# Patient Record
Sex: Female | Born: 1990 | Race: White | Hispanic: No | Marital: Single | Smoking: Former smoker
Health system: Southern US, Community
[De-identification: ages and names within clinical notes are randomized; demographics above are authoritative.]

## PROBLEM LIST (undated history)

## (undated) HISTORY — PX: APPENDECTOMY: SHX54

---
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
## 2011-02-02 ENCOUNTER — Ambulatory Visit: Payer: Self-pay | Admitting: Gynecology

## 2011-04-01 ENCOUNTER — Ambulatory Visit: Payer: Self-pay | Admitting: Internal Medicine

## 2011-04-01 DIAGNOSIS — Z0289 Encounter for other administrative examinations: Secondary | ICD-10-CM

## 2011-04-22 NOTE — Discharge Summary (Signed)
Amanda Fuller, Amanda Fuller             ACCOUNT NO.:  [id number]   MEDICAL RECORD NO.:  [id number]          PATIENT TYPE:  INP   LOCATION:  6119                         FACILITY:  [facility]   PHYSICIAN:  Leonia Corona, M.D.  DATE OF BIRTH:  Apr 23, 1991   DATE OF ADMISSION:  11/03/2005  DATE OF DISCHARGE:  11/05/2005                                 DISCHARGE SUMMARY   REASON FOR ADMISSION:  Acute appendicitis.   DISCHARGE MEDICATIONS:  Tylenol 650 mg p.o. q.4-6h. p.r.n. pain.   DISCHARGE DIAGNOSIS:  Acute appendicitis status post open appendectomy.   HISTORY OF PRESENT ILLNESS:  On November 04, 2005, the patient complained of  central umbilical pain radiating down to the right lower quadrant with  nausea and vomiting.  The patient was brought into the emergency department  where a CT was performed which concurred with the diagnosis of acute  appendicitis.   HOSPITAL COURSE:  The patient had an open appendectomy performed by Dr.  Leeanne Mannan under general anesthesia on November 04, 2005 and had an inflamed  appendix removed.  There was no abscess.  The patient was taken to recovery  and then to the pediatric floor.  The patient tolerated the procedure well  and tolerated p.o. and advanced diet adequately.  Pain was well-controlled  on morphine and then Tylenol on a p.r.n. basis.  The patient was stable and  ready for discharge home and was passing gas and ambulating well.   PROCEDURE:  Open appendectomy on November 04, 2005.  Please see the dictated  operative report by Dr. Leeanne Mannan.   FOLLOW UP:  The patient is to follow up with Dr. Leeanne Mannan, pediatric  subspecialist, as scheduled in 10 days.  The patient does not have a primary  care physician and was given the name of Dr. Barth Kirks at [facility], fax number [phone] if they decide to use her as a primary  care physician.  Their results will be faxed to Dr. Roe Rutherford office.   DISCHARGE DATA:  Discharge weight was  53.2 kg.   CONDITION ON DISCHARGE:  Stable.     ______________________________  Pediatrics Resident      Leonia Corona, M.D.  Electronically Signed   PR/MEDQ  D:  11/05/2005  T:  11/06/2005  Job:  [id number]

## 2011-04-22 NOTE — Op Note (Signed)
NAMEMORIA, BROPHY             ACCOUNT NO.:  [id number]   MEDICAL RECORD NO.:  [id number]          PATIENT TYPE:  INP   LOCATION:  6119                         FACILITY:  [facility]   PHYSICIAN:  Leonia Corona, M.D.  DATE OF BIRTH:  1991/04/25   DATE OF PROCEDURE:  11/03/2005  DATE OF DISCHARGE:                                 OPERATIVE REPORT   PREOPERATIVE DIAGNOSIS:  Acute appendicitis.   POSTOPERATIVE DIAGNOSIS:  Acute appendicitis.   PROCEDURE PERFORMED:  Open appendectomy.   SURGEON:  Leonia Corona, M.D.   ASSISTANT:  Nurse.   ANESTHESIA:  General endotracheal anesthesia.   INDICATIONS FOR PROCEDURE:  This 20 year old female child was evaluated for  right lower quadrant abdominal pain clinically highly suspicious for acute  appendicitis.  The diagnosis was confirmed on CT scan, hence, the indication  of the procedure.   PROCEDURE IN DETAIL:  The patient is brought in the operating room, placed  supine on the operating table, general endotracheal anesthesia is given.  The right lower quadrant of the abdomen and surrounding area of the  abdominal wall was cleaned, prepped and draped in the usual manner.  A right  lower skin incision centered at McBurney's point measuring about 2-3 cm was  made with the knife.  The incision was deepened through the subcutaneous  tissue using electrocautery until the external aponeurosis was reached.  The  external aponeurosis was incised with a knife and divided with scissors.  The internal oblique and transversus abdominis muscles were split along its  fibers with the help of a blunt tip hemostat and [employer] retractor until  the peritoneum was visualized which was held between two hemostats and  incised inbetween with scissors.  A small opening into the peritoneal cavity  was made.  The peritoneal opening was enlarged with scissors, retractors  were placed inside the peritoneum, the cecum was identified and tinea were  followed  which lead to the appendix.  The appendix was held with Babcock  forceps and delivered out of the incision partially until it was completely  delivered out.  It was found to be inflamed, early inflammation was noted  all around the length of the appendix.  The base was healthy.  The  mesoappendix was divided between two clamps and ligated using 3-0 silk until  the base of the appendix was clear.  The base of the appendix was crushed  with a clamp above the base, the base was ligated using 2-0 Vicryl, and the  appendix was divided and removed from the field.  The mucosa of the  appendicular stump was cauterized.  A purse-string suture was placed around  the base on the cecal wall and tied.  After tying the purse-string suture  using 3-0 silk, the base of the appendix was buried on the cecal wall.  The  cecum was returned back into the peritoneal cavity.  The peritoneum was  irrigated with normal saline until the returning fluid was clear.  No edema  or bleeding was noted.  The peritoneum was closed with 2-0 Vicryl running  stitch, the wound was closed in layers.  The internal oblique and transverse  abdominis were reapproximated with a single stitch of 2-0 Vicryl.  The skin  was closed in two layers, the deep subcutaneous layer using a single stitch  of 4-0 Monocryl and the skin with 4-0 Monocryl subcuticular stitch.  Approximately 10 mL of 0.25% Marcaine with epinephrine was infiltrated in  and around the incision for  postoperative pain control.  Steri-Strips were applied which was covered  with a sterile gauze and Tegaderm dressing.  The patient tolerated the  procedure well which was smooth and uneventful.  The patient was extubated  and transferred to the recovery room in good, stable condition.      Leonia Corona, M.D.  Electronically Signed     SF/MEDQ  D:  11/04/2005  T:  11/04/2005  Job:  [id number]

## 2011-08-30 ENCOUNTER — Encounter: Payer: Self-pay | Admitting: *Deleted

## 2011-08-31 ENCOUNTER — Encounter: Payer: Self-pay | Admitting: Women's Health

## 2011-09-02 ENCOUNTER — Encounter: Payer: Self-pay | Admitting: Women's Health

## 2012-02-15 ENCOUNTER — Ambulatory Visit (INDEPENDENT_AMBULATORY_CARE_PROVIDER_SITE_OTHER): Payer: BC Managed Care – PPO | Admitting: Family

## 2012-02-15 ENCOUNTER — Encounter: Payer: Self-pay | Admitting: Family

## 2012-02-15 VITALS — BP 108/70 | Temp 98.7°F | Ht 65.5 in | Wt 123.0 lb

## 2012-02-15 DIAGNOSIS — F411 Generalized anxiety disorder: Secondary | ICD-10-CM

## 2012-02-15 DIAGNOSIS — F419 Anxiety disorder, unspecified: Secondary | ICD-10-CM

## 2012-02-15 LAB — CBC
HCT: 42.2 % (ref 36.0–46.0)
MCHC: 32.8 g/dL (ref 30.0–36.0)
MCV: 86.6 fl (ref 78.0–100.0)
Platelets: 267 10*3/uL (ref 150.0–400.0)
RBC: 4.88 Mil/uL (ref 3.87–5.11)
RDW: 12.7 % (ref 11.5–14.6)
WBC: 9.5 10*3/uL (ref 4.5–10.5)

## 2012-02-15 LAB — TSH: TSH: 1.15 u[IU]/mL (ref 0.35–5.50)

## 2012-02-15 LAB — BASIC METABOLIC PANEL
BUN: 15 mg/dL (ref 6–23)
CO2: 24 mEq/L (ref 19–32)
Calcium: 9.9 mg/dL (ref 8.4–10.5)
Chloride: 103 mEq/L (ref 96–112)
Creatinine, Ser: 0.8 mg/dL (ref 0.4–1.2)
GFR: 95.52 mL/min (ref >60.00)
Glucose, Bld: 87 mg/dL (ref 70–99)
Potassium: 3.7 mEq/L (ref 3.5–5.1)
Sodium: 138 mEq/L (ref 135–145)

## 2012-02-15 NOTE — Progress Notes (Signed)
Subjective:    Patient ID: Amanda Fuller, female    DOB: 10-18-1991, 20 y.o.   MRN: [id number]  HPI 20y/o female, new patient, is in today with c/o a bright light in her line of vision that occurs when she is deep in thought. She reports feeling more anxious  And worrying about possibly having brain cancer after watching a TV show last week with a young woman her age dying of cancer. In the past she's combated anxiety. She has never been on medication for treatment. She denies any thoughts of hopelessness, helplessness, thoughts of death or dying. Patient denies lightheadedness, dizziness, chest pain, palpitations, shortness of breath or edema. No family history of anxiety.   Review of Systems  Constitutional: Negative.   HENT: Negative.   Eyes: Positive for visual disturbance.       Bright light in her line of vision at times.   Respiratory: Negative.   Cardiovascular: Negative.   Musculoskeletal: Negative.   Skin: Negative.   Hematological: Negative.   Psychiatric/Behavioral: The patient is nervous/anxious.    No past medical history on file.  History   Social History  . Marital Status: Single    Spouse Name: N/A    Number of Children: N/A  . Years of Education: N/A   Occupational History  . Not on file.   Social History Main Topics  . Smoking status: Never Smoker   . Smokeless tobacco: Never Used  . Alcohol Use: No  . Drug Use: No  . Sexually Active: No   Other Topics Concern  . Not on file   Social History Narrative  . No narrative on file    Past Surgical History  Procedure Date  . Appendectomy     Family History  Problem Relation Age of Onset  . Diabetes Maternal Grandmother   . Hypertension Maternal Grandmother   . Heart disease Maternal Grandmother   . Heart disease Maternal Grandfather     No Known Allergies  Current Outpatient Prescriptions on File Prior to Visit  Medication Sig Dispense Refill  . Norgestimate-Ethinyl Estradiol Triphasic  (ORTHO TRI-CYCLEN LO) 0.18/0.215/0.25 MG-25 MCG tablet Take 1 tablet by mouth daily.          BP 108/70  Temp(Src) 98.7 F (37.1 C) (Oral)  Ht 5' 5.5" (1.664 m)  Wt 123 lb (55.792 kg)  BMI 20.16 kg/m2chart and     Objective:   Physical Exam  Constitutional: She is oriented to person, place, and time. She appears well-developed and well-nourished.  HENT:  Right Ear: External ear normal.  Left Ear: External ear normal.  Nose: Nose normal.  Mouth/Throat: Oropharynx is clear and moist.  Neck: Normal range of motion. Neck supple.  Cardiovascular: Normal rate, regular rhythm and normal heart sounds.   Pulmonary/Chest: Effort normal and breath sounds normal.  Abdominal: Soft. Bowel sounds are normal.  Musculoskeletal: Normal range of motion.  Neurological: She is alert and oriented to person, place, and time.  Skin: Skin is warm.  Psychiatric: She has a normal mood and affect.          Assessment & Plan:  Assessment: Anxiety, visual disturbances  Plan: Spoke to patient about the areas ways of adjusting her anxiety concerns to include therapy and pharmacological treatment. Patient has elected to go with group therapy and she's worried he checked into. She will consider medication if this isn't effective. Lab sent to include BMP, CBC and TSH Will notify patient of the results. Patient does not show any  signs and symptoms of a brain tumor. Therefore, further workup at this point I felt it necessary.

## 2012-02-15 NOTE — Patient Instructions (Signed)

## 2012-02-16 ENCOUNTER — Telehealth: Payer: Self-pay | Admitting: Family

## 2012-02-16 NOTE — Telephone Encounter (Signed)
Left message on personally identified voicemail to notify pt labs are normal and have been mailed to address on file

## 2012-02-16 NOTE — Telephone Encounter (Signed)
Pt requesting results of labs. Please contact °

## 2012-03-28 ENCOUNTER — Ambulatory Visit (INDEPENDENT_AMBULATORY_CARE_PROVIDER_SITE_OTHER): Payer: BC Managed Care – PPO | Admitting: Family

## 2012-03-28 ENCOUNTER — Encounter: Payer: Self-pay | Admitting: Family

## 2012-03-28 VITALS — BP 100/70 | Temp 98.5°F | Wt 122.0 lb

## 2012-03-28 DIAGNOSIS — J029 Acute pharyngitis, unspecified: Secondary | ICD-10-CM

## 2012-03-28 MED ORDER — AMOXICILLIN 400 MG/5ML PO SUSR: 800.0000 mg | Freq: Two times a day (BID) | ORAL | Status: AC

## 2012-03-28 NOTE — Progress Notes (Signed)
  Subjective:    Patient ID: Amanda Fuller, female    DOB: [birth year], 21 y.o.   MRN: [id number]  HPI Comments: 21 yo  [place] female presents with c/o inflamed, red tonsils started 5 days ago. "I have history of tonsilitis, 2 episodes over the last year and it was suggested I could have them removed if it bothers me. I can not afford to miss work and I am ok with liquid antibiotic therapy." Denies sorethroat, fever, chills, ear pain, headaches, nausea,difficulty swallowing, or other associated s/s. C/o occasional productive cough expectorating scanty white sputum.   Sore Throat  Associated symptoms include congestion. Pertinent negatives include no drooling, ear discharge, ear pain, neck pain or trouble swallowing.      Review of Systems  Constitutional: Negative.   HENT: Positive for congestion. Negative for hearing loss, ear pain, nosebleeds, sore throat, facial swelling, rhinorrhea, sneezing, drooling, mouth sores, trouble swallowing, neck pain, neck stiffness, dental problem, voice change, postnasal drip, sinus pressure, tinnitus and ear discharge.   Eyes: Negative.   Respiratory: Negative.   Cardiovascular: Negative.    No past medical history on file.  History   Social History  . Marital Status: Single    Spouse Name: N/A    Number of Children: N/A  . Years of Education: N/A   Occupational History  . Not on file.   Social History Main Topics  . Smoking status: Never Smoker   . Smokeless tobacco: Never Used  . Alcohol Use: No  . Drug Use: No  . Sexually Active: No   Other Topics Concern  . Not on file   Social History Narrative  . No narrative on file    Past Surgical History  Procedure Date  . Appendectomy     Family History  Problem Relation Age of Onset  . Diabetes Maternal Grandmother   . Hypertension Maternal Grandmother   . Heart disease Maternal Grandmother   . Heart disease Maternal Grandfather     No Known Allergies  Current Outpatient  Prescriptions on File Prior to Visit  Medication Sig Dispense Refill  . Norgestimate-Ethinyl Estradiol Triphasic (ORTHO TRI-CYCLEN LO) 0.18/0.215/0.25 MG-25 MCG tablet Take 1 tablet by mouth daily.          BP 100/70  Temp(Src) 98.5 F (36.9 C) (Oral)  Wt 122 lb (55.339 kg)chart    Objective:   Physical Exam  Constitutional: She is oriented to person, place, and time. She appears well-developed and well-nourished. No distress.  HENT:  Right Ear: External ear normal.  Left Ear: External ear normal.  Nose: Nose normal.  Mouth/Throat: Oropharynx is clear and moist.  Eyes: Right eye exhibits no discharge. Left eye exhibits no discharge.  Cardiovascular: Normal rate, regular rhythm, normal heart sounds and intact distal pulses.  Exam reveals no gallop and no friction rub.   No murmur heard. Pulmonary/Chest: Effort normal and breath sounds normal. No respiratory distress. She has no wheezes. She has no rales. She exhibits no tenderness.  Abdominal: Soft. Bowel sounds are normal. She exhibits no distension and no mass. There is no tenderness. There is no rebound and no guarding.  Neurological: She is alert and oriented to person, place, and time.  Skin: Skin is warm and dry. She is not diaphoretic.          Assessment & Plan:  Assessment: Pharyngitis  Plan; Amoxicillin, teaching handouts provided on diagnosis and treatment. Encouraged to RTC if s/s persist or get worse

## 2012-03-28 NOTE — Patient Instructions (Signed)

## 2012-08-28 ENCOUNTER — Encounter: Payer: Self-pay | Admitting: Family

## 2012-08-28 ENCOUNTER — Ambulatory Visit (INDEPENDENT_AMBULATORY_CARE_PROVIDER_SITE_OTHER): Payer: BC Managed Care – PPO | Admitting: Family

## 2012-08-28 VITALS — BP 108/74 | HR 98 | Temp 98.7°F | Wt 124.0 lb

## 2012-08-28 DIAGNOSIS — H538 Other visual disturbances: Secondary | ICD-10-CM

## 2012-08-28 NOTE — Progress Notes (Signed)
  Subjective:    Patient ID: Amanda Fuller, female    DOB: 1991-07-27, 21 y.o.   MRN: [id number]  HPI 21 year old white female, nonsmoker is in with complaints of an episode of blurred vision 3 weeks ago her left eye. She then noticed 2 small pimple-like lesions to the lower eyelid. Denies any irritation. Denies any injury. Today her vision appears to be fine. Denies any sneezing, coughing, congestion, itchy or watery eyes, matting or crusting.   Review of Systems  Constitutional: Negative.   HENT: Negative.   Eyes: Negative for pain, discharge and itching.       Patient c/o 2 small "dots" inside the bottom eyelid.   Respiratory: Negative.   Cardiovascular: Negative.   Gastrointestinal: Negative.   Musculoskeletal: Negative.   Skin: Negative.   Neurological: Negative.   Hematological: Negative.   Psychiatric/Behavioral: Negative.        No past medical history on file.  History   Social History  . Marital Status: Single    Spouse Name: N/A    Number of Children: N/A  . Years of Education: N/A   Occupational History  . Not on file.   Social History Main Topics  . Smoking status: Never Smoker   . Smokeless tobacco: Never Used  . Alcohol Use: No  . Drug Use: No  . Sexually Active: No   Other Topics Concern  . Not on file   Social History Narrative  . No narrative on file    Past Surgical History  Procedure Date  . Appendectomy     Family History  Problem Relation Age of Onset  . Diabetes Maternal Grandmother   . Hypertension Maternal Grandmother   . Heart disease Maternal Grandmother   . Heart disease Maternal Grandfather     No Known Allergies  Current Outpatient Prescriptions on File Prior to Visit  Medication Sig Dispense Refill  . Norgestimate-Ethinyl Estradiol Triphasic (ORTHO TRI-CYCLEN LO) 0.18/0.215/0.25 MG-25 MCG tablet Take 1 tablet by mouth daily.          BP 108/74  Pulse 98  Temp 98.7 F (37.1 C) (Oral)  Wt 124 lb (56.246 kg)   SpO2 99%chart Objective:   Physical Exam  Constitutional: She is oriented to person, place, and time. She appears well-developed. She appears distressed.  Eyes: Conjunctivae normal and EOM are normal. Pupils are equal, round, and reactive to light. Right eye exhibits no discharge. Left eye exhibits no discharge. No scleral icterus.       Vision 20/13 uncorrected in both eyes.  Neck: Normal range of motion. Neck supple.  Cardiovascular: Normal rate, regular rhythm and normal heart sounds.   Neurological: She is alert and oriented to person, place, and time.  Skin: Skin is warm and dry.  Psychiatric: She has a normal mood and affect.          Assessment & Plan:  Assessment: Blurred Vision  Plan: normal eye exam. See opthalmology of symptoms return.

## 2012-12-07 ENCOUNTER — Ambulatory Visit (INDEPENDENT_AMBULATORY_CARE_PROVIDER_SITE_OTHER): Payer: BC Managed Care – PPO | Admitting: Family Medicine

## 2012-12-07 ENCOUNTER — Encounter: Payer: Self-pay | Admitting: Family Medicine

## 2012-12-07 VITALS — BP 98/60 | HR 71 | Temp 98.1°F | Wt 127.0 lb

## 2012-12-07 DIAGNOSIS — Q383 Other congenital malformations of tongue: Secondary | ICD-10-CM

## 2012-12-07 MED ORDER — NYSTATIN 100000 UNIT/ML MT SUSP: 500000.0000 [IU] | Freq: Four times a day (QID) | OROMUCOSAL | Status: DC

## 2012-12-07 NOTE — Patient Instructions (Signed)
-  use nystatin according to instruction for 7 days  -avoid any colored foods or beverages  -follow up with your provider for your physical

## 2012-12-07 NOTE — Progress Notes (Signed)
Chief Complaint  Patient presents with  . gland that hurts on left side  . tongue color change    orange color    HPI: -started: 1 week ago - started with sore throat, congestion, fever, cough -symptoms: see above, went to urgent care last week and given zpack - flu and strep negative, remaining symptoms swollen gland on the L and orange color on tongue, seeing orthodontist for the braces which irritated her mouth, tongue irritation -denies:fever, SOB, NVD, tooth pain, strep or mono exposure -has tried: tylenol liquid - orange, cranberry juice  -sick contacts: work contacts  ROS: See pertinent positives and negatives per HPI.  No past medical history on file.  Family History  Problem Relation Age of Onset  . Diabetes Maternal Grandmother   . Hypertension Maternal Grandmother   . Heart disease Maternal Grandmother   . Heart disease Maternal Grandfather     History   Social History  . Marital Status: Single    Spouse Name: N/A    Number of Children: N/A  . Years of Education: N/A   Social History Main Topics  . Smoking status: Never Smoker   . Smokeless tobacco: Never Used  . Alcohol Use: No  . Drug Use: No  . Sexually Active: No   Other Topics Concern  . None   Social History Narrative  . None    Current outpatient prescriptions:Norgestimate-Ethinyl Estradiol Triphasic (ORTHO TRI-CYCLEN LO) 0.18/0.215/0.25 MG-25 MCG tablet, Take 1 tablet by mouth daily.  , Disp: , Rfl: ;  nystatin (MYCOSTATIN) 100000 UNIT/ML suspension, Take 5 mLs (500,000 Units total) by mouth 4 (four) times daily., Disp: 60 mL, Rfl: 0  EXAM:  Filed Vitals:   12/07/12 1046  BP: 98/60  Pulse: 71  Temp: 98.1 F (36.7 C)    There is no height on file to calculate BMI.  GENERAL: vitals reviewed and listed above, alert, oriented, appears well hydrated and in no acute distress  HEENT: atraumatic, conjunttiva clear, no obvious abnormalities on inspection of external nose and ears, normal  appearance of ear canals and TMs, clear nasal congestion, mild post oropharyngeal erythema with PND, no tonsillar edema or exudate, no sinus TTP, mildly orange (neon) twinge to tongue  NECK: no obvious masses on inspection  LUNGS: clear to auscultation bilaterally, no wheezes, rales or rhonchi, good air movement  CV: HRRR, no peripheral edema  MS: moves all extremities without noticeable abnormality  PSYCH: pleasant and cooperative, no obvious depression or anxiety  ASSESSMENT AND PLAN:  Discussed the following assessment and plan:  1. Tongue anomaly  nystatin (MYCOSTATIN) 100000 UNIT/ML suspension   -likely from something she has eaten or a beverage or medicine - pt worried is candida - will tx with nystatin for 7 days  -pt need CPE - advised to schedule with PCP -Patient advised to return or notify a doctor immediately if symptoms worsen or persist or new concerns arise.  Patient Instructions  -use nystatin according to instruction for 7 days  -avoid any colored foods or beverages  -follow up with your provider for your physical     Terressa Koyanagi.

## 2012-12-11 ENCOUNTER — Other Ambulatory Visit: Payer: Self-pay | Admitting: Family Medicine

## 2012-12-12 ENCOUNTER — Other Ambulatory Visit: Payer: Self-pay | Admitting: Family Medicine

## 2012-12-12 NOTE — Telephone Encounter (Signed)
Pt needs refill of this mouthwash. It is working.   Several request. Dr Selena Batten filled this for her. Can you call in to [facility]/ [address]?

## 2012-12-13 NOTE — Telephone Encounter (Signed)
Pls advise if pt can have refill.

## 2012-12-18 ENCOUNTER — Telehealth: Payer: Self-pay | Admitting: Family

## 2012-12-18 NOTE — Telephone Encounter (Signed)
Called pt's mother and made aware that pt would need follow up apt with [facility].  Pt's mother is aware and made an appt for 12/21/12 at 3:15.

## 2012-12-18 NOTE — Telephone Encounter (Signed)
Patient's mom called stating that her daughter would like to be referred to a Specialist for the orange colored tongue that still exists. Please advise/assist.

## 2012-12-21 ENCOUNTER — Ambulatory Visit: Payer: BC Managed Care – PPO | Admitting: Family

## 2012-12-21 DIAGNOSIS — Z0289 Encounter for other administrative examinations: Secondary | ICD-10-CM

## 2013-01-04 ENCOUNTER — Ambulatory Visit: Payer: BC Managed Care – PPO | Admitting: Family

## 2013-01-04 DIAGNOSIS — Z0289 Encounter for other administrative examinations: Secondary | ICD-10-CM

## 2013-01-10 ENCOUNTER — Telehealth: Payer: Self-pay | Admitting: Family

## 2013-01-10 DIAGNOSIS — K148 Other diseases of tongue: Secondary | ICD-10-CM

## 2013-01-10 NOTE — Telephone Encounter (Signed)
Patient called stating that she would like a call back concerning having a referral to an ENT. Please assist.

## 2013-01-11 NOTE — Telephone Encounter (Signed)
Tongue turns yellow and she brushes it off and throughout the day it comes off x 2 months.  Ok to refer to ENT, per [facility]

## 2013-04-05 ENCOUNTER — Other Ambulatory Visit (HOSPITAL_COMMUNITY)
Admission: RE | Admit: 2013-04-05 | Discharge: 2013-04-05 | Disposition: A | Discharge status: Home / Self Care | Payer: BC Managed Care – PPO | Source: Ambulatory Visit | Attending: Obstetrics and Gynecology | Admitting: Obstetrics and Gynecology

## 2013-04-05 ENCOUNTER — Encounter: Payer: Self-pay | Admitting: Women's Health

## 2013-04-05 ENCOUNTER — Ambulatory Visit (INDEPENDENT_AMBULATORY_CARE_PROVIDER_SITE_OTHER): Payer: BC Managed Care – PPO | Admitting: Women's Health

## 2013-04-05 VITALS — BP 108/60 | Ht 65.5 in | Wt 122.0 lb

## 2013-04-05 DIAGNOSIS — Z01419 Encounter for gynecological examination (general) (routine) without abnormal findings: Secondary | ICD-10-CM | POA: Insufficient documentation

## 2013-04-05 DIAGNOSIS — Z309 Encounter for contraceptive management, unspecified: Secondary | ICD-10-CM

## 2013-04-05 DIAGNOSIS — IMO0001 Reserved for inherently not codable concepts without codable children: Secondary | ICD-10-CM

## 2013-04-05 MED ORDER — NORETHIN ACE-ETH ESTRAD-FE 1-20 MG-MCG PO TABS: 1.0000 | ORAL_TABLET | Freq: Every day | ORAL | Status: DC

## 2013-04-05 NOTE — Progress Notes (Signed)
Amanda Fuller [facility] 02/23/1991 [id number]    History:    The patient presents for annual exam with no complaints. Irregular cycle every 28 -40 days with periods lasting 5-7 day. Heavy flow days 1-2. LMP "end of march-beginning of April." First partner/condoms/negative STD screen. Completed Gardasil series. Has used pills in the past and would like to start again.   Past medical history, past surgical history, family history and social history were all reviewed and documented in the EPIC chart. 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 Works full time at [employer].   ROS:  A  ROS was performed and pertinent positives and negatives are included in the history.  Exam:  Filed Vitals:   04/05/13 1149  BP: 108/60    General appearance:  Normal Head/Neck:  Normal, without cervical or supraclavicular adenopathy. Thyroid:  Symmetrical, normal in size, without palpable masses or nodularity. Respiratory  Effort:  Normal  Auscultation:  Clear without wheezing or rhonchi Cardiovascular  Auscultation:  Regular rate, without rubs, murmurs or gallops  Edema/varicosities:  Not grossly evident Abdominal  Soft,nontender, without masses, guarding or rebound.  Liver/spleen:  No organomegaly noted  Hernia:  None appreciated  Skin  Inspection:  Grossly normal  Palpation:  Grossly normal Neurologic/psychiatric  Orientation:  Normal with appropriate conversation.  Mood/affect:  Normal  Genitourinary    Breasts: Examined lying and sitting/bilateral piercing     Right: Without masses, retractions, discharge or axillary adenopathy.     Left: Without masses, retractions, discharge or axillary adenopathy.   Inguinal/mons:  Normal without inguinal adenopathy  External genitalia:  Normal  BUS/Urethra/Skene's glands:  Normal  Bladder:  Normal  Vagina:  Normal  Cervix:  Normal  Uterus:  normal in size, shape and contour.  Midline and mobile  Adnexa/parametria:     Rt: Without masses or  tenderness.   Lt: Without masses or tenderness.  Anus and perineum: Normal   Assessment/Plan:  22 y.o.  [name] G0 for annual exam, no complaints.  Normal GYN exam  Contraception counseling   Plan: Contraception options. Loestrin 1/20, reviewed prescription, proper use, start up instructions given and reviewed importance of condoms first month and for infection control. Reviewed slight risk for blood clots or stroke. CBC, UA, pap, discussed new screening guidlines. SBE's, calcium rich foods, daily multivitamin. Encouraged healthy diet and exercise and safety.  Harrington Challenger [facility], 12:27 PM 04/05/2013

## 2013-04-05 NOTE — Addendum Note (Signed)
Addended by: Richardson Chiquito on: 04/05/2013 02:27 PM   Modules accepted: Orders

## 2013-04-05 NOTE — Patient Instructions (Signed)
Health Maintenance, 22- to 21-Year-Old SCHOOL PERFORMANCE After high school completion, the young adult may be attending college, technical or vocational school, or entering the military or the work force. SOCIAL AND EMOTIONAL DEVELOPMENT The young adult establishes adult relationships and explores sexual identity. Young adults may be living at home or in a college dorm or apartment. Increasing independence is important with young adults. Throughout adolescence, teens should assume responsibility of their own health care. IMMUNIZATIONS Most young adults should be fully vaccinated. A booster dose of Tdap (tetanus, diphtheria, and pertussis, or "whooping cough"), a dose of meningococcal vaccine to protect against a certain type of bacterial meningitis, hepatitis A, human papillomarvirus (HPV), chickenpox, or measles vaccines may be indicated, if not given at an earlier age. Annual influenza or "flu" vaccination should be considered during flu season.   TESTING Annual screening for vision and hearing problems is recommended. Vision should be screened objectively at least once between 22 and 21 years of age. The young adult may be screened for anemia or tuberculosis. Young adults should have a blood test to check for high cholesterol during this time period. Young adults should be screened for use of alcohol and drugs. If the young adult is sexually active, screening for sexually transmitted infections, pregnancy, or HIV may be performed. Screening for cervical cancer should be performed within 3 years of beginning sexual activity. NUTRITION AND ORAL HEALTH  Adequate calcium intake is important. Consume 3 servings of low-fat milk and dairy products daily. For those who do not drink milk or consume dairy products, calcium enriched foods, such as juice, bread, or cereal, dark, leafy greens, or canned fish are alternate sources of calcium.   Drink plenty of water. Limit fruit juice to 8 to 12 ounces per day.  Avoid sugary beverages or sodas.   Discourage skipping meals, especially breakfast. Teens should eat a good variety of vegetables and fruits, as well as lean meats.   Avoid high fat, high salt, and high sugar foods, such as candy, chips, and cookies.   Encourage young adults to participate in meal planning and preparation.   Eat meals together as a family whenever possible. Encourage conversation at mealtime.   Limit fast food choices and eating out at restaurants.   Brush teeth twice a day and floss.   Schedule dental exams twice a year.  SLEEP Regular sleep habits are important. PHYSICAL, SOCIAL, AND EMOTIONAL DEVELOPMENT  One hour of regular physical activity daily is recommended. Continue to participate in sports.   Encourage young adults to develop their own interests and consider community service or volunteerism.   Provide guidance to the young adult in making decisions about college and work plans.   Make sure that young adults know that they should never be in a situation that makes them uncomfortable, and they should tell partners if they do not want to engage in sexual activity.   Talk to the young adult about body image. Eating disorders may be noted at this time. Young adults may also be concerned about being overweight. Monitor the young adult for weight gain or loss.   Mood disturbances, depression, anxiety, alcoholism, or attention problems may be noted in young adults. Talk to the caregiver if there are concerns about mental illness.   Negotiate limit setting and independent decision making.   Encourage the young adult to handle conflict without physical violence.   Avoid loud noises which may impair hearing.   Limit television and computer time to 2 hours per   day. Individuals who engage in excessive sedentary activity are more likely to become overweight.  RISK BEHAVIORS  Sexually active young adults need to take precautions against pregnancy and sexually  transmitted infections. Talk to young adults about contraception.   Provide a tobacco-free and drug-free environment for the young adult. Talk to the young adult about drug, tobacco, and alcohol use among friends or at friends' homes. Make sure the young adult knows that smoking tobacco or marijuana and taking drugs have health consequences and may impact brain development.   Teach the young adult about appropriate use of over-the-counter or prescription medicines.   Establish guidelines for driving and for riding with friends.   Talk to young adults about the risks of drinking and driving or boating. Encourage the young adult to call you if he or she or friends have been drinking or using drugs.   Remind young adults to wear seat belts at all times in cars and life vests in boats.   Young adults should always wear a properly fitted helmet when they are riding a bicycle.   Use caution with all-terrain vehicles (ATVs) or other motorized vehicles.   Do not keep handguns in the home. (If you do, the gun and ammunition should be locked separately and out of the young adult's access.)   Equip your home with smoke detectors and change the batteries regularly. Make sure all family members know the fire escape plans for your home.   Teach young adults not to swim alone and not to dive in shallow water.   All individuals should wear sunscreen that protects against UVA and UVB light with at least a sun protection factor (SPF) of 30 when out in the sun. This minimizes sun burning.  WHAT'S NEXT? Young adults should visit their pediatrician or family physician yearly. By young adulthood, health care should be transitioned to a family physician or internal medicine specialist. Sexually active females may want to begin annual physical exams with a gynecologist. Document Released: 02/16/2007 Document Revised: 02/13/2012 Document Reviewed: 03/08/2007 ExitCare Patient Information 2013 [employer].    

## 2013-04-06 LAB — CBC WITH DIFFERENTIAL/PLATELET
Basophils Absolute: 0.1 10*3/uL (ref 0.0–0.1)
Eosinophils Absolute: 0.1 10*3/uL (ref 0.0–0.7)
Eosinophils Relative: 1 % (ref 0–5)
HCT: 43.6 % (ref 36.0–46.0)
Hemoglobin: 14.4 g/dL (ref 12.0–15.0)
Lymphs Abs: 1.3 10*3/uL (ref 0.7–4.0)
MCHC: 33 g/dL (ref 30.0–36.0)
MCV: 84.5 fL (ref 78.0–100.0)
Monocytes Absolute: 0.6 10*3/uL (ref 0.1–1.0)
Monocytes Relative: 7 % (ref 3–12)
Neutro Abs: 6.3 10*3/uL (ref 1.7–7.7)
Neutrophils Relative %: 75 % (ref 43–77)
Platelets: 249 10*3/uL (ref 150–400)
RBC: 5.16 MIL/uL — ABNORMAL HIGH (ref 3.87–5.11)
WBC: 8.3 10*3/uL (ref 4.0–10.5)

## 2013-04-06 LAB — URINALYSIS W MICROSCOPIC + REFLEX CULTURE
Bacteria, UA: NONE SEEN
Bilirubin Urine: NEGATIVE
Casts: NONE SEEN
Crystals: NONE SEEN
Glucose, UA: NEGATIVE mg/dL
Leukocytes, UA: NEGATIVE
Protein, ur: NEGATIVE mg/dL
Specific Gravity, Urine: 1.025 (ref 1.005–1.030)
pH: 8 (ref 5.0–8.0)

## 2013-10-10 ENCOUNTER — Other Ambulatory Visit: Payer: Self-pay

## 2014-02-20 ENCOUNTER — Ambulatory Visit (INDEPENDENT_AMBULATORY_CARE_PROVIDER_SITE_OTHER): Payer: BC Managed Care – PPO | Admitting: Physician Assistant

## 2014-02-20 ENCOUNTER — Encounter: Payer: Self-pay | Admitting: Physician Assistant

## 2014-02-20 VITALS — BP 110/72 | HR 65 | Temp 99.2°F | Wt 128.0 lb

## 2014-02-20 DIAGNOSIS — J069 Acute upper respiratory infection, unspecified: Secondary | ICD-10-CM

## 2014-02-20 NOTE — Progress Notes (Signed)
  Subjective:    HPI  complains of cold symptoms  Onset <1 day ago, wax/wane symptoms  associated with rhinorrhea, sore throat, mild headache and low grade fever Also PND with minimal cough. Reports her mother saw spots in the back of throat yesterday. Has not used any symptomatic relief. Precipitated by sick contacts at work, states a  person coughed on her the day before.  History reviewed. No pertinent past medical history.  Review of Systems Constitutional: No change in appetite or activity, no unexpected weight change HEENT: no ear pain, pain or difficulty swallowing, no facial pain or pressure. Pulmonary: No wheezing or hemoptysis Cardiovascular: No chest pain or palpitations Abdomen: No N/V. diarrhea     Objective:   Physical Exam  BP 110/72  Pulse 65  Temp(Src) 99.2 F (37.3 C) (Oral)  Wt 128 lb (58.06 kg)  SpO2 96%  GEN: mildly ill appearing and audible head/chest congestion HEENT: NCAT, bilateral TM's without erythema, injections or bulging, no sinus tenderness bilaterally, nares with clear discharge, oropharynx mild erythema, no exudate, clear PND noted to posterior oropharynx  Eyes: Vision grossly intact, no conjunctivitis Lungs: Clear to auscultation without rhonchi or wheeze, no increased work of breathing Cardiovascular: Regular rate and rhythm, no bilateral edema Lymph: No cervical, tonsillar or supraclavicular lymphadenopathy noted.       Assessment & Plan:  Viral URI  Cough, postnasal drip related to above   Explained lack of efficacy for antibiotics in viral disease Suggest throat lozenge or hard candy to keep back of throat moist. Symptomatic care with Tylenol or Advil, hydration and rest -  salt gargle advised as needed  Instructed to phone office or schedule follow up appointment if no improvement or symptoms become worse.  I did tell her could phone in Rx if needed.

## 2014-02-20 NOTE — Patient Instructions (Signed)
It was great to meet you today Amanda Fuller!  As we discussed I think what you are experiencing is likely viral related and does not require antibiotic treatment at this time. Can use a cough drop or hard candy to keep the back of the throat moist. If symptoms become much worse do not hesitate to call and I can order a prescription for you. Or you can schedule a follow up appointment if you want to be seen.   Feel better soon.   Upper Respiratory Infection, Adult An upper respiratory infection (URI) is also known as the common cold. It is often caused by a type of germ (virus). Colds are easily spread (contagious). You can pass it to others by kissing, coughing, sneezing, or drinking out of the same glass. Usually, you get better in 1 or 2 weeks.  HOME CARE   Only take medicine as told by your doctor.  Use a warm mist humidifier or breathe in steam from a hot shower.  Drink enough water and fluids to keep your pee (urine) clear or pale yellow.  Get plenty of rest.  Return to work when your temperature is back to normal or as told by your doctor. You may use a face mask and wash your hands to stop your cold from spreading. GET HELP RIGHT AWAY IF:   After the first few days, you feel you are getting worse.  You have questions about your medicine.  You have chills, shortness of breath, or brown or red spit (mucus).  You have yellow or brown snot (nasal discharge) or pain in the face, especially when you bend forward.  You have a fever, puffy (swollen) neck, pain when you swallow, or white spots in the back of your throat.  You have a bad headache, ear pain, sinus pain, or chest pain.  You have a high-pitched whistling sound when you breathe in and out (wheezing).  You have a lasting cough or cough up blood.  You have sore muscles or a stiff neck. MAKE SURE YOU:   Understand these instructions.  Will watch your condition.  Will get help right away if you are not doing well  or get worse. Document Released: 05/09/2008 Document Revised: 02/13/2012 Document Reviewed: 03/28/2011 [facility]ExitCare Patient Information 2014 [place], [place].

## 2014-02-20 NOTE — Progress Notes (Signed)
Pre visit review using our clinic review tool, if applicable. No additional management support is needed unless otherwise documented below in the visit note. 

## 2015-01-15 ENCOUNTER — Other Ambulatory Visit (HOSPITAL_COMMUNITY)
Admission: RE | Admit: 2015-01-15 | Discharge: 2015-01-15 | Disposition: A | Discharge status: Home / Self Care | Payer: BLUE CROSS/BLUE SHIELD | Source: Ambulatory Visit | Attending: Gynecology | Admitting: Gynecology

## 2015-01-15 ENCOUNTER — Encounter: Payer: Self-pay | Admitting: Women's Health

## 2015-01-15 ENCOUNTER — Ambulatory Visit (INDEPENDENT_AMBULATORY_CARE_PROVIDER_SITE_OTHER): Payer: BLUE CROSS/BLUE SHIELD | Admitting: Women's Health

## 2015-01-15 VITALS — BP 120/82 | Ht 65.0 in | Wt 138.0 lb

## 2015-01-15 DIAGNOSIS — Z01419 Encounter for gynecological examination (general) (routine) without abnormal findings: Secondary | ICD-10-CM

## 2015-01-15 DIAGNOSIS — Z3040 Encounter for surveillance of contraceptives, unspecified: Secondary | ICD-10-CM

## 2015-01-15 DIAGNOSIS — Z113 Encounter for screening for infections with a predominantly sexual mode of transmission: Secondary | ICD-10-CM

## 2015-01-15 DIAGNOSIS — Z13220 Encounter for screening for lipoid disorders: Secondary | ICD-10-CM

## 2015-01-15 MED ORDER — NORETHIN ACE-ETH ESTRAD-FE 1-20 MG-MCG PO TABS: 1.0000 | ORAL_TABLET | Freq: Every day | ORAL | Status: AC

## 2015-01-15 NOTE — Patient Instructions (Signed)

## 2015-01-15 NOTE — Progress Notes (Signed)
[facility] 01-25-1991 [id number]    History:    Presents for annual exam.  Light monthly cycle on Loestrin with no complaints. Gardasil series completed. Normal Pap history. New partner.  Past medical history, past surgical history, family history and social history were all reviewed and documented in the EPIC chart. Works for  [employer].   ROS:  A ROS was performed and pertinent positives and negatives are included.  Exam:  Filed Vitals:   01/15/15 1104  BP: 120/82    General appearance:  Normal Thyroid:  Symmetrical, normal in size, without palpable masses or nodularity. Respiratory  Auscultation:  Clear without wheezing or rhonchi Cardiovascular  Auscultation:  Regular rate, without rubs, murmurs or gallops  Edema/varicosities:  Not grossly evident Abdominal  Soft,nontender, without masses, guarding or rebound.  Liver/spleen:  No organomegaly noted  Hernia:  None appreciated  Skin  Inspection:  Grossly normal   Breasts: Examined lying and sitting.     Right: Without masses, retractions, discharge or axillary adenopathy.     Left: Without masses, retractions, discharge or axillary adenopathy. Gentitourinary   Inguinal/mons:  Normal without inguinal adenopathy  External genitalia:  Normal  BUS/Urethra/Skene's glands:  Normal  Vagina:  Normal  Cervix:  Normal  Uterus:   normal in size, shape and contour.  Midline and mobile  Adnexa/parametria:     Rt: Without masses or tenderness.   Lt: Without masses or tenderness.  Anus and perineum: Normal    Assessment/Plan:  24 y.o. S WF G0 for annual exam with no complaints.  Contraception management  Plan: Options reviewed will continue Loestrin 1/20 prescription, proper use given and reviewed start up instructions,has been off for 1 month. Reviewed importance of condoms especially first month and until permanent partner. SBE's, regular exercise, calcium rich diet, MVI daily encouraged. CBC, UA, Pap,  GC/Chlamydia, HIV, hep B, C, RPR.  Harrington ChallengerYOUNG,Yazmin Locher J WHNP, 1:01 PM 01/15/2015

## 2015-01-16 LAB — GC/CHLAMYDIA PROBE AMP
CT Probe RNA: NEGATIVE
GC PROBE AMP APTIMA: NEGATIVE

## 2015-01-16 LAB — URINALYSIS W MICROSCOPIC + REFLEX CULTURE
Bilirubin Urine: NEGATIVE
CASTS: NONE SEEN
Glucose, UA: NEGATIVE mg/dL
HGB URINE DIPSTICK: NEGATIVE
Ketones, ur: NEGATIVE mg/dL
Leukocytes, UA: NEGATIVE
Nitrite: NEGATIVE
PH: 5.5 (ref 5.0–8.0)
PROTEIN: NEGATIVE mg/dL
Specific Gravity, Urine: >1.03 — ABNORMAL HIGH (ref 1.005–1.030)
Urobilinogen, UA: 0.2 mg/dL (ref 0.0–1.0)

## 2015-01-16 LAB — CYTOLOGY - PAP

## 2015-01-17 LAB — URINE CULTURE
Colony Count: NO GROWTH
ORGANISM ID, BACTERIA: NO GROWTH

## 2015-01-21 ENCOUNTER — Other Ambulatory Visit: Payer: Self-pay

## 2015-01-28 ENCOUNTER — Other Ambulatory Visit: Payer: Self-pay

## 2015-02-06 ENCOUNTER — Encounter (HOSPITAL_COMMUNITY)
Admission: EM | Disposition: A | Discharge status: Home / Self Care | Payer: Self-pay | Source: Home / Self Care | Attending: Emergency Medicine

## 2015-02-06 ENCOUNTER — Encounter (HOSPITAL_COMMUNITY): Payer: Self-pay | Admitting: Emergency Medicine

## 2015-02-06 ENCOUNTER — Emergency Department (HOSPITAL_COMMUNITY): Payer: Worker's Compensation

## 2015-02-06 ENCOUNTER — Emergency Department (HOSPITAL_COMMUNITY): Payer: Worker's Compensation | Admitting: Anesthesiology

## 2015-02-06 ENCOUNTER — Ambulatory Visit (HOSPITAL_COMMUNITY)
Admission: EM | Admit: 2015-02-06 | Discharge: 2015-02-06 | Disposition: A | Discharge status: Home / Self Care | Payer: Worker's Compensation | Attending: Emergency Medicine | Admitting: Emergency Medicine

## 2015-02-06 DIAGNOSIS — W25XXXA Contact with sharp glass, initial encounter: Secondary | ICD-10-CM | POA: Insufficient documentation

## 2015-02-06 DIAGNOSIS — Y9269 Other specified industrial and construction area as the place of occurrence of the external cause: Secondary | ICD-10-CM | POA: Insufficient documentation

## 2015-02-06 DIAGNOSIS — S66322A Laceration of extensor muscle, fascia and tendon of right middle finger at wrist and hand level, initial encounter: Secondary | ICD-10-CM | POA: Diagnosis not present

## 2015-02-06 DIAGNOSIS — S66320A Laceration of extensor muscle, fascia and tendon of right index finger at wrist and hand level, initial encounter: Secondary | ICD-10-CM | POA: Diagnosis not present

## 2015-02-06 DIAGNOSIS — M6789 Other specified disorders of synovium and tendon, multiple sites: Secondary | ICD-10-CM

## 2015-02-06 DIAGNOSIS — Z9049 Acquired absence of other specified parts of digestive tract: Secondary | ICD-10-CM | POA: Insufficient documentation

## 2015-02-06 DIAGNOSIS — T148 Other injury of unspecified body region: Secondary | ICD-10-CM | POA: Diagnosis present

## 2015-02-06 DIAGNOSIS — S61411A Laceration without foreign body of right hand, initial encounter: Secondary | ICD-10-CM | POA: Diagnosis not present

## 2015-02-06 DIAGNOSIS — IMO0002 Reserved for concepts with insufficient information to code with codable children: Secondary | ICD-10-CM

## 2015-02-06 DIAGNOSIS — Z87891 Personal history of nicotine dependence: Secondary | ICD-10-CM | POA: Insufficient documentation

## 2015-02-06 DIAGNOSIS — Y990 Civilian activity done for income or pay: Secondary | ICD-10-CM | POA: Diagnosis not present

## 2015-02-06 HISTORY — PX: TENDON REPAIR: SHX5111

## 2015-02-06 LAB — COMPREHENSIVE METABOLIC PANEL
ALT: 17 U/L (ref 0–35)
ANION GAP: 10 (ref 5–15)
AST: 28 U/L (ref 0–37)
Albumin: 4.3 g/dL (ref 3.5–5.2)
Alkaline Phosphatase: 49 U/L (ref 39–117)
BUN: 9 mg/dL (ref 6–23)
CO2: 23 mmol/L (ref 19–32)
Calcium: 9.6 mg/dL (ref 8.4–10.5)
Chloride: 104 mmol/L (ref 96–112)
Creatinine, Ser: 0.8 mg/dL (ref 0.50–1.10)
GFR calc Af Amer: >90 mL/min (ref >90)
GLUCOSE: 98 mg/dL (ref 70–99)
Potassium: 4.1 mmol/L (ref 3.5–5.1)
Sodium: 137 mmol/L (ref 135–145)
Total Bilirubin: 0.8 mg/dL (ref 0.3–1.2)
Total Protein: 7.3 g/dL (ref 6.0–8.3)

## 2015-02-06 LAB — CBC WITH DIFFERENTIAL/PLATELET
Basophils Absolute: 0.1 10*3/uL (ref 0.0–0.1)
Basophils Relative: 1 % (ref 0–1)
EOS PCT: 0 % (ref 0–5)
Eosinophils Absolute: 0 10*3/uL (ref 0.0–0.7)
HCT: 41.9 % (ref 36.0–46.0)
Hemoglobin: 13.9 g/dL (ref 12.0–15.0)
LYMPHS ABS: 1.8 10*3/uL (ref 0.7–4.0)
Lymphocytes Relative: 22 % (ref 12–46)
MCH: 28.6 pg (ref 26.0–34.0)
MCHC: 33.2 g/dL (ref 30.0–36.0)
MCV: 86.2 fL (ref 78.0–100.0)
Monocytes Absolute: 0.5 10*3/uL (ref 0.1–1.0)
Monocytes Relative: 6 % (ref 3–12)
Neutro Abs: 5.7 10*3/uL (ref 1.7–7.7)
Neutrophils Relative %: 71 % (ref 43–77)
Platelets: 277 10*3/uL (ref 150–400)
RBC: 4.86 MIL/uL (ref 3.87–5.11)
RDW: 12.5 % (ref 11.5–15.5)
WBC: 8 10*3/uL (ref 4.0–10.5)

## 2015-02-06 LAB — POC URINE PREG, ED: Preg Test, Ur: NEGATIVE

## 2015-02-06 SURGERY — TENDON REPAIR
Anesthesia: General | Site: Hand | Laterality: Right

## 2015-02-06 MED ORDER — LACTATED RINGERS IV SOLN
INTRAVENOUS | Status: DC | PRN
Start: 2015-02-06 — End: 2015-02-06
  Administered 2015-02-06: 20:00:00 via INTRAVENOUS

## 2015-02-06 MED ORDER — 0.9 % SODIUM CHLORIDE (POUR BTL) OPTIME
TOPICAL | Status: DC | PRN
  Administered 2015-02-06: 1000 mL

## 2015-02-06 MED ORDER — MIDAZOLAM HCL 5 MG/5ML IJ SOLN
INTRAMUSCULAR | Status: DC | PRN
  Administered 2015-02-06: 2 mg via INTRAVENOUS

## 2015-02-06 MED ORDER — ONDANSETRON HCL 4 MG/2ML IJ SOLN
INTRAMUSCULAR | Status: DC | PRN
  Administered 2015-02-06: 4 mg via INTRAVENOUS

## 2015-02-06 MED ORDER — FENTANYL CITRATE 0.05 MG/ML IJ SOLN
INTRAMUSCULAR | Status: AC
  Filled 2015-02-06: qty 5

## 2015-02-06 MED ORDER — CEFAZOLIN SODIUM-DEXTROSE 2-3 GM-% IV SOLR
INTRAVENOUS | Status: DC | PRN
  Administered 2015-02-06: 2 g via INTRAVENOUS

## 2015-02-06 MED ORDER — FENTANYL CITRATE 0.05 MG/ML IJ SOLN
INTRAMUSCULAR | Status: DC | PRN
Start: 2015-02-06 — End: 2015-02-06
  Administered 2015-02-06 (×2): 50 ug via INTRAVENOUS

## 2015-02-06 MED ORDER — PROPOFOL 10 MG/ML IV BOLUS
INTRAVENOUS | Status: DC | PRN
  Administered 2015-02-06: 200 mg via INTRAVENOUS

## 2015-02-06 MED ORDER — BUPIVACAINE HCL (PF) 0.25 % IJ SOLN
INTRAMUSCULAR | Status: AC
  Filled 2015-02-06: qty 30

## 2015-02-06 MED ORDER — LIDOCAINE HCL (CARDIAC) 20 MG/ML IV SOLN
INTRAVENOUS | Status: DC | PRN
  Administered 2015-02-06: 20 mg via INTRAVENOUS

## 2015-02-06 MED ORDER — MIDAZOLAM HCL 2 MG/2ML IJ SOLN
INTRAMUSCULAR | Status: AC
  Filled 2015-02-06: qty 2

## 2015-02-06 MED ORDER — DOCUSATE SODIUM 100 MG PO CAPS: 100.0000 mg | ORAL_CAPSULE | Freq: Two times a day (BID) | ORAL | Status: AC

## 2015-02-06 MED ORDER — TETANUS-DIPHTH-ACELL PERTUSSIS 5-2.5-18.5 LF-MCG/0.5 IM SUSP
0.5000 mL | Freq: Once | INTRAMUSCULAR | Status: AC
  Administered 2015-02-06: 0.5 mL via INTRAMUSCULAR
  Filled 2015-02-06: qty 0.5

## 2015-02-06 MED ORDER — HYDROCODONE-ACETAMINOPHEN 7.5-325 MG/15ML PO SOLN: 15.0000 mL | Freq: Four times a day (QID) | ORAL | Status: AC | PRN

## 2015-02-06 MED ORDER — BUPIVACAINE HCL (PF) 0.25 % IJ SOLN
INTRAMUSCULAR | Status: DC | PRN
  Administered 2015-02-06: 10 mL

## 2015-02-06 MED ORDER — PROPOFOL 10 MG/ML IV BOLUS
INTRAVENOUS | Status: AC
  Filled 2015-02-06: qty 20

## 2015-02-06 SURGICAL SUPPLY — 56 items
BANDAGE ELASTIC 3 VELCRO ST LF (GAUZE/BANDAGES/DRESSINGS) ×1 IMPLANT
BANDAGE ELASTIC 4 VELCRO ST LF (GAUZE/BANDAGES/DRESSINGS) IMPLANT
BNDG CMPR 9X4 STRL LF SNTH (GAUZE/BANDAGES/DRESSINGS) ×1
BNDG COHESIVE 1X5 TAN STRL LF (GAUZE/BANDAGES/DRESSINGS) IMPLANT
BNDG CONFORM 2 STRL LF (GAUZE/BANDAGES/DRESSINGS) ×1 IMPLANT
BNDG ESMARK 4X9 LF (GAUZE/BANDAGES/DRESSINGS) ×2 IMPLANT
BNDG GAUZE ELAST 4 BULKY (GAUZE/BANDAGES/DRESSINGS) ×1 IMPLANT
CORDS BIPOLAR (ELECTRODE) ×2 IMPLANT
COVER SURGICAL LIGHT HANDLE (MISCELLANEOUS) ×2 IMPLANT
CUFF TOURNIQUET SINGLE 18IN (TOURNIQUET CUFF) ×2 IMPLANT
CUFF TOURNIQUET SINGLE 24IN (TOURNIQUET CUFF) IMPLANT
DRAPE SURG 17X23 STRL (DRAPES) ×2 IMPLANT
DRSG ADAPTIC 3X8 NADH LF (GAUZE/BANDAGES/DRESSINGS) IMPLANT
DRSG EMULSION OIL 3X3 NADH (GAUZE/BANDAGES/DRESSINGS) ×1 IMPLANT
GAUZE SPONGE 2X2 8PLY STRL LF (GAUZE/BANDAGES/DRESSINGS) IMPLANT
GAUZE SPONGE 4X4 12PLY STRL (GAUZE/BANDAGES/DRESSINGS) IMPLANT
GLOVE BIO SURGEON STRL SZ8 (GLOVE) ×2 IMPLANT
GLOVE BIOGEL PI IND STRL 8.5 (GLOVE) ×1 IMPLANT
GLOVE BIOGEL PI INDICATOR 8.5 (GLOVE) ×2
GLOVE BIOGEL PI ORTHO PRO SZ7 (GLOVE) ×1
GLOVE PI ORTHO PRO STRL SZ7 (GLOVE) IMPLANT
GLOVE SURG ORTHO 8.0 STRL STRW (GLOVE) ×2 IMPLANT
GLOVE SURG SS PI 6.5 STRL IVOR (GLOVE) ×2 IMPLANT
GOWN STRL REUS W/ TWL LRG LVL3 (GOWN DISPOSABLE) ×2 IMPLANT
GOWN STRL REUS W/ TWL XL LVL3 (GOWN DISPOSABLE) ×1 IMPLANT
GOWN STRL REUS W/TWL LRG LVL3 (GOWN DISPOSABLE) ×2
GOWN STRL REUS W/TWL XL LVL3 (GOWN DISPOSABLE) ×4
KIT BASIN OR (CUSTOM PROCEDURE TRAY) ×2 IMPLANT
KIT ROOM TURNOVER OR (KITS) ×2 IMPLANT
MANIFOLD NEPTUNE II (INSTRUMENTS) ×2 IMPLANT
NDL HYPO 25GX1X1/2 BEV (NEEDLE) IMPLANT
NEEDLE HYPO 25GX1X1/2 BEV (NEEDLE) IMPLANT
NS IRRIG 1000ML POUR BTL (IV SOLUTION) ×2 IMPLANT
PACK ORTHO EXTREMITY (CUSTOM PROCEDURE TRAY) ×2 IMPLANT
PAD ARMBOARD 7.5X6 YLW CONV (MISCELLANEOUS) ×4 IMPLANT
PAD CAST 3X4 CTTN HI CHSV (CAST SUPPLIES) IMPLANT
PAD CAST 4YDX4 CTTN HI CHSV (CAST SUPPLIES) IMPLANT
PADDING CAST COTTON 3X4 STRL (CAST SUPPLIES) ×2
PADDING CAST COTTON 4X4 STRL (CAST SUPPLIES)
SOAP 2 % CHG 4 OZ (WOUND CARE) ×2 IMPLANT
SPECIMEN JAR SMALL (MISCELLANEOUS) ×2 IMPLANT
SPLINT FIBERGLASS 3X12 (CAST SUPPLIES) ×1 IMPLANT
SPONGE GAUZE 2X2 STER 10/PKG (GAUZE/BANDAGES/DRESSINGS)
SPONGE GAUZE 4X4 12PLY STER LF (GAUZE/BANDAGES/DRESSINGS) ×1 IMPLANT
SUCTION FRAZIER TIP 10 FR DISP (SUCTIONS) ×1 IMPLANT
SUT ETHIBOND 4 0 TF (SUTURE) ×1 IMPLANT
SUT MERSILENE 4 0 P 3 (SUTURE) IMPLANT
SUT PROLENE 4 0 PS 2 18 (SUTURE) ×2 IMPLANT
SUT VIC AB 2-0 CT1 27 (SUTURE)
SUT VIC AB 2-0 CT1 TAPERPNT 27 (SUTURE) IMPLANT
SYR CONTROL 10ML LL (SYRINGE) IMPLANT
TOWEL OR 17X24 6PK STRL BLUE (TOWEL DISPOSABLE) ×2 IMPLANT
TOWEL OR 17X26 10 PK STRL BLUE (TOWEL DISPOSABLE) ×2 IMPLANT
TUBE CONNECTING 12X1/4 (SUCTIONS) IMPLANT
UNDERPAD 30X30 INCONTINENT (UNDERPADS AND DIAPERS) ×2 IMPLANT
WATER STERILE IRR 1000ML POUR (IV SOLUTION) ×1 IMPLANT

## 2015-02-06 NOTE — ED Notes (Addendum)
Pt presents with laceration to top of right hand from a vase falling on her hand, distal CNS intact- pt able to move all fingers without difficulty.  Unknown last tetanus.  Hand wrapped by UCC, bleeding controlled at present.

## 2015-02-06 NOTE — Anesthesia Procedure Notes (Signed)
Procedure Name: Intubation Date/Time: 02/06/2015 8:32 PM Performed by: Arlice ColtMANESS, Kamariya Blevens B Pre-anesthesia Checklist: Patient identified, Emergency Drugs available, Suction available, Patient being monitored and Timeout performed Patient Re-evaluated:Patient Re-evaluated prior to inductionOxygen Delivery Method: Circle system utilized Preoxygenation: Pre-oxygenation with 100% oxygen Intubation Type: IV induction and Rapid sequence Laryngoscope Size: Mac and 3 Grade View: Grade I Tube type: Oral Tube size: 7.5 mm Number of attempts: 1 Airway Equipment and Method: Stylet Placement Confirmation: ETT inserted through vocal cords under direct vision,  positive ETCO2 and breath sounds checked- equal and bilateral Secured at: 21 cm Tube secured with: Tape Dental Injury: Teeth and Oropharynx as per pre-operative assessment

## 2015-02-06 NOTE — ED Notes (Signed)
Pt to Xray at this time

## 2015-02-06 NOTE — Anesthesia Postprocedure Evaluation (Signed)
  Anesthesia Post-op Note  Patient: Amanda Fuller  Procedure(s) Performed: Procedure(s): TENDON REPAIR Right Hand (Right)  Patient Location: PACU  Anesthesia Type:General  Level of Consciousness: awake, alert , oriented and patient cooperative  Airway and Oxygen Therapy: Patient Spontanous Breathing  Post-op Pain: none  Post-op Assessment: Post-op Vital signs reviewed, Patient's Cardiovascular Status Stable, Respiratory Function Stable, Patent Airway, No signs of Nausea or vomiting and Pain level controlled  Post-op Vital Signs: Reviewed and stable  Last Vitals:  Filed Vitals:   02/06/15 2200  BP: 117/76  Pulse: 97  Temp:   Resp: 18    Complications: No apparent anesthesia complications

## 2015-02-06 NOTE — Brief Op Note (Signed)
02/06/2015  7:00 PM  PATIENT:  Amanda Fuller  24 y.o. female  PRE-OPERATIVE DIAGNOSIS:  Right Hand Laceration  POST-OPERATIVE DIAGNOSIS:  * No post-op diagnosis entered *  PROCEDURE:  Procedure(s): TENDON REPAIR Right Hand (Right)  SURGEON:  Surgeon(s) and Role:    * Sharma CovertFred W Viana Sleep, MD - Primary  PHYSICIAN ASSISTANT:   ASSISTANTS: none   ANESTHESIA:   general  EBL:     BLOOD ADMINISTERED:none  DRAINS: none   LOCAL MEDICATIONS USED:  MARCAINE     SPECIMEN:  No Specimen  DISPOSITION OF SPECIMEN:  N/A  COUNTS:  YES  TOURNIQUET:  * No tourniquets in log *  DICTATION: .Other Dictation: Dictation Number [id number]  PLAN OF CARE: Discharge to home after PACU  PATIENT DISPOSITION:  PACU - hemodynamically stable.   Delay start of Pharmacological VTE agent (>24hrs) due to surgical blood loss or risk of bleeding: not applicable

## 2015-02-06 NOTE — ED Provider Notes (Signed)
CSN: [id number]     Arrival date & time 02/06/15  1554 History  This chart was scribed for non-physician practitioner, Raymon MuttonMarissa Gracynn Rajewski PA, working with Tilden FossaElizabeth Rees, MD, by Tanda RockersMargaux Venter, ED Scribe. This patient was seen in room TR11C/TR11C and the patient's care was started at 4:29 PM.    Chief Complaint  Patient presents with  . Laceration   The history is provided by the patient. No language interpreter was used.     HPI Comments: Amanda Fuller is a 24 y.o. female with no known PMHx who is left hand dominant presents to the Emergency Department complaining of a laceration to the right hand that occurred approximately 1 hour prior to arrival. Pt reports that she was working when a vase fell onto her right hand, causing the laceration. She states that she wrapped her hand immediately after the laceration occurred and called EMS. Pt states that the bleeding is now under control. She complains of increased pain to the hand. Pt describes the pain as a throbbing sensation. She claims that at first the pain felt like it was radiating into her forearm, but that it is now localized to her right hand. She is unsure whether or not she is UTD on tetanus. She denies any numbness, tingling, or loss of sensation.  LMP: February 2016.  PCP Dr. Orvan Falconerampbell  History reviewed. No pertinent past medical history. Past Surgical History  Procedure Laterality Date  . Appendectomy     Family History  Problem Relation Age of Onset  . Diabetes Maternal Grandmother   . Hypertension Maternal Grandmother   . Heart disease Maternal Grandmother   . Heart disease Maternal Grandfather    History  Substance Use Topics  . Smoking status: Former Games developermoker  . Smokeless tobacco: Never Used     Comment: only smoked for 6 months  . Alcohol Use: 0.0 oz/week    0 Standard drinks or equivalent per week     Comment: sometimes   OB History    Gravida Para Term Preterm AB TAB SAB Ectopic Multiple Living   0               Review of Systems  Musculoskeletal:       Right hand pain.   Skin: Positive for wound.  Neurological: Negative for weakness and numbness.  Hematological: Does not bruise/bleed easily.      Allergies  Review of patient's allergies indicates no known allergies.  Home Medications   Prior to Admission medications   Medication Sig Start Date End Date Taking? Authorizing Provider  Cholecalciferol (VITAMIN D PO) Take 1 tablet by mouth daily.    Historical Provider, MD  Multiple Vitamin (MULTIVITAMIN) capsule Take 1 capsule by mouth daily.    Historical Provider, MD  norethindrone-ethinyl estradiol (JUNEL FE,GILDESS FE,LOESTRIN FE) 1-20 MG-MCG tablet Take 1 tablet by mouth daily. 01/15/15   Harrington ChallengerNancy J Young, NP   Triage Vitals: BP 125/87 mmHg  Pulse 105  Temp(Src) 98.8 F (37.1 C) (Oral)  Resp 14  SpO2 99%  LMP 01/07/2015   Physical Exam  Constitutional: She is oriented to person, place, and time. She appears well-developed and well-nourished. No distress.  HENT:  Head: Normocephalic and atraumatic.  Eyes: Conjunctivae and EOM are normal. Right eye exhibits no discharge. Left eye exhibits no discharge.  Neck: Normal range of motion. Neck supple.  Cardiovascular: Normal rate, regular rhythm and normal heart sounds.   Cap refill < 3 seconds  Pulmonary/Chest: Effort normal and breath sounds  normal. No respiratory distress. She has no wheezes. She has no rales.  Musculoskeletal:  Patient is able to produce a fist with her right hand without difficulty. Decreased extension of the index and long finger of the right hand-appears to have extensor tendon disruption.  Neurological: She is alert and oriented to person, place, and time. No cranial nerve deficit. She exhibits normal muscle tone. Coordination normal.  Strength intact to the MCP, PIP, DIP joints of the right hand. Decreased extension of the index and long finger of the right hand-extensor tendon disruption noted Sensation intact  with differentiation to sharp and dull touch  Skin: Skin is warm and dry. No rash noted. She is not diaphoretic. No erythema.  Approximately 2.5 inch laceration localized to the dorsal aspect of the right hand - jagged edged. Laceration through the first and second extensor tendon identified. Missing tissue identified.  Please see photo below  Psychiatric: She has a normal mood and affect. Her behavior is normal. Thought content normal.  Nursing note and vitals reviewed.   ED Course  Procedures (including critical care time)   DIAGNOSTIC STUDIES: Oxygen Saturation is 99% on RA, normal by my interpretation.       Labs Review Labs Reviewed  CBC WITH DIFFERENTIAL/PLATELET  COMPREHENSIVE METABOLIC PANEL  POC URINE PREG, ED    Imaging Review Dg Hand Complete Right  02/06/2015   CLINICAL DATA:  Right hand laceration just proximal to second third MCP joints 2 hr ago.  EXAM: RIGHT HAND - COMPLETE 3+ VIEW  COMPARISON:  None.  FINDINGS: soft tissue injury about the dorsal metacarpal phalangeal joints on the lateral view. No radiopaque foreign object. No acute fracture or dislocation.  IMPRESSION: No acute osseous abnormality.   Electronically Signed   By: Jeronimo Greaves M.D.   On: 02/06/2015 17:16     EKG Interpretation None       5:32 PM this provider spoke with Dr. Orlan Leavens, hand specialist. Discussed case in great detail, physical examination, labs, imaging. Patient to be taken to the OR. Physician recommended CBC, CMP to be ordered for patient seen to be wrapped. Reported that nurse will call down with OR is ready.  MDM   Final diagnoses:  Laceration  Extensor tendon disruption    Medications  Tdap (BOOSTRIX) injection 0.5 mL (not administered)    Filed Vitals:   02/06/15 1604  BP: 125/87  Pulse: 105  Temp: 98.8 F (37.1 C)  TempSrc: Oral  Resp: 14  SpO2: 99%   Patient presenting to the ED with right dorsal hand laceration that occurred an hour prior to arrival to the  emergency department when the patient dropped a vase on her right hand. Approximately 2.5 inch laceration to the ventral aspect of the right hand, jagged edged in missing tissue. Pulses palpable and strong-radial pulses 2+. Cap refill less than 3 seconds. Full flexion of the right hand identified. Diminished extension of the right index and right long finger. Extensor tendon disruption identified to the right index finger and right long finger. Sensation intact with differentiation sharp and dull touch. Urine pregnancy negative. Right hand no osseous abnormalities identified. No radiopaque foreign body noted. No acute fracture dislocation seen. Tetanus administered in ED setting. This provider spoke with hand specialist, Dr. Alma Friendly. Hand surgeon to take patient to the OR today. Discussed with patient plan, patient is in accordance. Patient stable for transfer.  Raymon Mutton, PA-C 02/06/15 1752  Asjia Berrios, PA-C 02/06/15 1811  Tilden Fossa, MD 02/06/15 [phone]

## 2015-02-06 NOTE — Transfer of Care (Signed)
Immediate Anesthesia Transfer of Care Note  Patient: Amanda Fuller  Procedure(s) Performed: Procedure(s): TENDON REPAIR Right Hand (Right)  Patient Location: PACU  Anesthesia Type:General  Level of Consciousness: awake, alert  and oriented  Airway & Oxygen Therapy: Patient Spontanous Breathing  Post-op Assessment: Report given to RN and Post -op Vital signs reviewed and stable  Post vital signs: Reviewed and stable  Last Vitals:  Filed Vitals:   02/06/15 1903  BP: 128/74  Pulse: 91  Temp: 37 C  Resp: 16    Complications: No apparent anesthesia complications

## 2015-02-06 NOTE — H&P (Signed)
Amanda Fuller is an 24 y.o. female.   Chief Complaint: RIGHT HAND LACERATION HPI: Amanda Fuller is a 24 y.o. female with no known PMHx who is left hand dominant presents to the Emergency Department complaining of a laceration to the right hand that occurred approximately 1 hour prior to arrival. Pt reports that she was working when a vase fell onto her right hand, causing the laceration. She states that she wrapped her hand immediately after the laceration occurred and called EMS. Pt states that the bleeding is now under control. She complains of increased pain to the hand. Pt describes the pain as a throbbing sensation. She claims that at first the pain felt like it was radiating into her forearm, but that it is now localized to her right hand. She is unsure whether or not she is UTD on tetanus. She denies any numbness, tingling, or loss of sensation. LMP: February 2016.  PCP Dr. Orvan Falconerampbell  History reviewed. No pertinent past medical history.  Past Surgical History  Procedure Laterality Date  . Appendectomy      Family History  Problem Relation Age of Onset  . Diabetes Maternal Grandmother   . Hypertension Maternal Grandmother   . Heart disease Maternal Grandmother   . Heart disease Maternal Grandfather    Social History:  reports that she has quit smoking. She has never used smokeless tobacco. She reports that she drinks alcohol. She reports that she does not use illicit drugs.  Allergies: No Known Allergies   (Not in a hospital admission)  Results for orders placed or performed during the hospital encounter of 02/06/15 (from the past 48 hour(s))  POC urine preg, [facility])     Status: None   Collection Time: 02/06/15  4:57 PM  Result Value Ref Range   Preg Test, Ur NEGATIVE NEGATIVE    Comment:        THE SENSITIVITY OF THIS METHODOLOGY IS >24 mIU/mL   CBC with Differential/Platelet     Status: None   Collection Time: 02/06/15  6:15 PM  Result Value Ref Range   WBC 8.0 4.0 - 10.5 K/uL   RBC 4.86 3.87 - 5.11 MIL/uL   Hemoglobin 13.9 12.0 - 15.0 g/dL   HCT 96.041.9 45.436.0 - 09.846.0 %   MCV 86.2 78.0 - 100.0 fL   MCH 28.6 26.0 - 34.0 pg   MCHC 33.2 30.0 - 36.0 g/dL   RDW 11.912.5 14.711.5 - 82.915.5 %   Platelets 277 150 - 400 K/uL   Neutrophils Relative % 71 43 - 77 %   Neutro Abs 5.7 1.7 - 7.7 K/uL   Lymphocytes Relative 22 12 - 46 %   Lymphs Abs 1.8 0.7 - 4.0 K/uL   Monocytes Relative 6 3 - 12 %   Monocytes Absolute 0.5 0.1 - 1.0 K/uL   Eosinophils Relative 0 0 - 5 %   Eosinophils Absolute 0.0 0.0 - 0.7 K/uL   Basophils Relative 1 0 - 1 %   Basophils Absolute 0.1 0.0 - 0.1 K/uL   Dg Hand Complete Right  02/06/2015   CLINICAL DATA:  Right hand laceration just proximal to second third MCP joints 2 hr ago.  EXAM: RIGHT HAND - COMPLETE 3+ VIEW  COMPARISON:  None.  FINDINGS: soft tissue injury about the dorsal metacarpal phalangeal joints on the lateral view. No radiopaque foreign object. No acute fracture or dislocation.  IMPRESSION: No acute osseous abnormality.   Electronically Signed   By: Ronaldo MiyamotoKyle  Reche Dixon M.D.   On: 02/06/2015 17:16    ROS NO RECENT ILLNESSES OR HOSPITALIZATIONS  Blood pressure 125/87, pulse 105, temperature 98.8 F (37.1 C), temperature source Oral, resp. rate 14, last menstrual period 01/07/2015, SpO2 99 %. Physical Exam   General Appearance:  Alert, cooperative, no distress, appears stated age  Head:  Normocephalic, without obvious abnormality, atraumatic  Eyes:  Pupils equal, conjunctiva/corneas clear,         Throat: Lips, mucosa, and tongue normal; teeth and gums normal  Neck: No visible masses     Lungs:   respirations unlabored  Chest Wall:  No tenderness or deformity  Heart:  Regular rate and rhythm,  Abdomen:   Soft, non-tender,         Extremities: RIGHT HAND: WOUND PICTURES IN CHART UNABLE TO EXTEND INDEX AND LONG FINGERS FINGERS WARM WELL PERFUSED NO VOLAR WOUNDS GOOD THUMB MOBILITY  Pulses: 2+ and symmetric  Skin: Skin  color, texture, turgor normal, no rashes or lesions     Neurologic: Normal    Assessment/Plan RIGHT HAND LACERATION WITH TENDON INVOLVEMENT  RIGHT HAND LACERATION WOUND EXPLORATION AND TENDON REPAIR AS INDICATED  R/B/A DISCUSSED WITH PT IN HOSPITAL.  PT VOICED UNDERSTANDING OF PLAN CONSENT SIGNED DAY OF SURGERY PT SEEN AND EXAMINED PRIOR TO OPERATIVE PROCEDURE/DAY OF SURGERY SITE MARKED. QUESTIONS ANSWERED WILL GO HOME FOLLOWING SURGERY  WE ARE PLANNING SURGERY FOR YOUR UPPER EXTREMITY. THE RISKS AND BENEFITS OF SURGERY INCLUDE BUT NOT LIMITED TO BLEEDING INFECTION, DAMAGE TO NEARBY NERVES ARTERIES TENDONS, FAILURE OF SURGERY TO ACCOMPLISH ITS INTENDED GOALS, PERSISTENT SYMPTOMS AND NEED FOR FURTHER SURGICAL INTERVENTION. WITH THIS IN MIND WE WILL PROCEED. I HAVE DISCUSSED WITH THE PATIENT THE PRE AND POSTOPERATIVE REGIMEN AND THE DOS AND DON'TS. PT VOICED UNDERSTANDING AND INFORMED CONSENT SIGNED.  [name]  02/06/2015 

## 2015-02-06 NOTE — ED Notes (Signed)
Right hand wrapped in DSD.  Pt waiting for OR

## 2015-02-06 NOTE — Anesthesia Preprocedure Evaluation (Addendum)
Anesthesia Evaluation  Patient identified by MRN, date of birth, ID band Patient awake    Reviewed: Allergy & Precautions, NPO status , Patient's Chart, lab work & pertinent test results  Airway Mallampati: II  TM Distance: >3 FB Neck ROM: Full    Dental  (+) Teeth Intact   Pulmonary former smoker,  breath sounds clear to auscultation        Cardiovascular Rhythm:Regular     Neuro/Psych    GI/Hepatic   Endo/Other    Renal/GU      Musculoskeletal   Abdominal (+)  Abdomen: soft.    Peds  Hematology   Anesthesia Other Findings   Reproductive/Obstetrics 02/06/15/ Preg test: NEG                           Anesthesia Physical Anesthesia Plan  ASA: I and emergent  Anesthesia Plan: General   Post-op Pain Management:    Induction: Intravenous and Rapid sequence  Airway Management Planned: Oral ETT  Additional Equipment:   Intra-op Plan:   Post-operative Plan: Extubation in OR  Informed Consent: I have reviewed the patients History and Physical, chart, labs and discussed the procedure including the risks, benefits and alternatives for the proposed anesthesia with the patient or authorized representative who has indicated his/her understanding and acceptance.   Dental advisory given  Plan Discussed with: Anesthesiologist and Surgeon  Anesthesia Plan Comments:         Anesthesia Quick Evaluation

## 2015-02-06 NOTE — ED Notes (Signed)
Pt has lac. To top of right hand with lacerated tendon exposed.  Bleeding controlled

## 2015-02-06 NOTE — Discharge Instructions (Signed)
KEEP BANDAGE CLEAN AND DRY CALL OFFICE FOR F/U APPT [id number] in 10 days [facility] [phone] ALSO MAKE THERAPY APPOINTMENT IN 10 DAYS [phone] EXT 1601 TO FOLLOW APPT KEEP HAND ELEVATED ABOVE HEART OK TO APPLY ICE TO OPERATIVE AREA CONTACT OFFICE IF ANY WORSENING PAIN OR CONCERNS.

## 2015-02-06 NOTE — ED Notes (Signed)
Patient transported to X-ray 

## 2015-02-06 NOTE — ED Notes (Signed)
All clothing removed and placed in hospital gown.  Clothing given to pt's mom in a pt belonging bag with name label on bag.

## 2015-02-07 NOTE — Op Note (Signed)
[name], Amanda Fuller             ACCOUNT NO.:  [id number]  MEDICAL RECORD NO.:  [id number]  LOCATION:  [facility]                         FACILITY:  [facility]  PHYSICIAN:  Madelynn DoneFred W Mariska Daffin IV, MD  DATE OF BIRTH:  [birth year]  DATE OF PROCEDURE:  02/06/2015 DATE OF DISCHARGE:  02/06/2015                              OPERATIVE REPORT   PREOPERATIVE DIAGNOSIS:  Right hand laceration with tendon involvement.  POSTOPERATIVE DIAGNOSIS:  Right hand laceration with tendon involvement.  ATTENDING PHYSICIAN:  Sharma CovertFred W. [place] Pettengill IV, MD who scrubbed and present for the entire procedure.  ASSISTANT SURGEON:  None.  ANESTHESIA:  General via LMA, endotracheal tube.  SURGICAL PROCEDURE: 1. Right index finger extensor digitorum communis repair to the index     finger. 2. Right index finger extensor indicis proprius tendon repair in the     dorsum of the hand. 3. Right long finger EDC to the long finger tendon repair in the     dorsum of the hand. 4. Right hand traumatic laceration repair, 4.5 cm.  SURGICAL INDICATIONS:  [name] is a right-hand-dominant female who was at work sustained an injury to the dorsal aspect of the hand.  The patient was seen and evaluated in the emergency department and based on her nature of her injuries recommended she undergo the above procedure. Risks, benefits, and alternatives were discussed in detail with the patient.  Signed informed consent was obtained.  Risks include, but not limited to bleeding, infection; damage to nearby nerves, arteries, or tendons; loss of motion wrist and digits, incomplete relief of symptoms, and need for further surgical intervention.  DESCRIPTION OF PROCEDURE:  The patient was properly identified in the preoperative holding area and marked with a permanent marker made on the right hand to indicate the correct operative site.  The patient was then brought back to the operating room, placed supine on anesthesia room table.  General  anesthesia was administered.  The patient received preoperative antibiotics prior to skin incision.  Right upper extremity was then prepped and draped in normal sterile fashion.  Time-out was called.  Correct site was identified, and procedure then begun. Attention then turned to the traumatic laceration.  This was opened up and just slightly extended proximally and distally.  This exposed this is right at the level of zone 5 at the MP joint.  This is just proximal to the sagittal bands, just at the level of the sagittal band.  Once this was carefully identified, the patient had a complete laceration to the [employer] IncEDC to the index finger using a 4-0 Ethibond suture, Modified Kessler, and figure-of-eight mattress sutures were then placed within the tendon and sewing and repairing the EDC to the index finger nicely. The patient had a partial laceration approximately 60% of the EIP and therefore tendon repair was then carried out of the side-to-side manner of the EIP.  Once this was completed, the partial laceration near 40% laceration to the extensor digitorum to the long finger was then repaired with Ethibond suture in simple figure-of-eight horizontal mattress sutures.  The wound was then thoroughly irrigated.  After thorough wound irrigation, the traumatic laceration was then closed with simple Prolene sutures.  Adaptic  dressing, sterile compressive bandage were then applied.  A 10 mL of 0.25% Marcaine infiltrated locally.  The patient was then placed in a well-padded volar splint, full extension, extubated, and taken to recovery room in good condition.  POSTPROCEDURE PLAN:  The patient discharged home, seen back in the office in approximately 10 days for wound check, suture removal, and begin a postoperative zone 5 extensor tendon rehab protocol.     Madelynn Done, MD     FWO/MEDQ  D:  02/06/2015  T:  02/07/2015  Job:  [id number]

## 2015-02-09 ENCOUNTER — Encounter (HOSPITAL_COMMUNITY): Payer: Self-pay | Admitting: Orthopedic Surgery

## 2016-06-06 IMAGING — DX DG HAND COMPLETE 3+V*R*
3 series · 3 of 3 positions shown · non-contrast
Comparison: None.

CLINICAL DATA: Right hand laceration just proximal to second third
MCP joints 2 hr ago.

EXAM:
RIGHT HAND - COMPLETE 3+ VIEW

[hand pa]
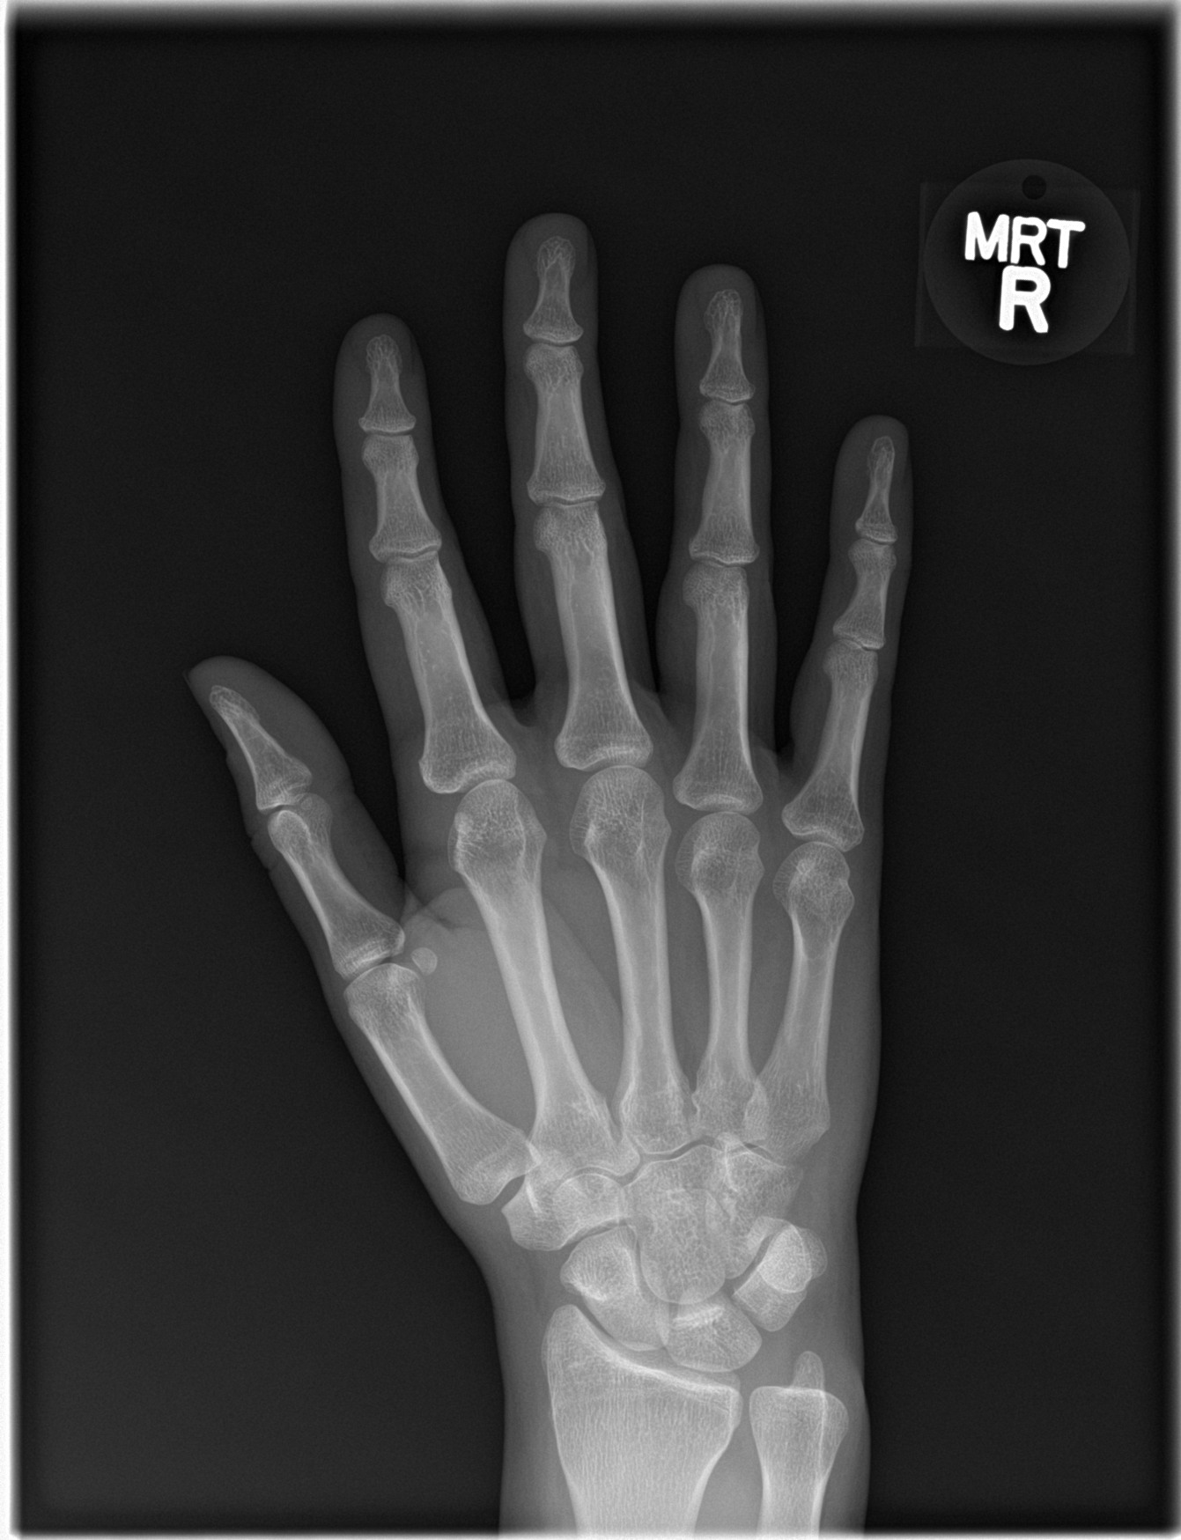

[hand obl]
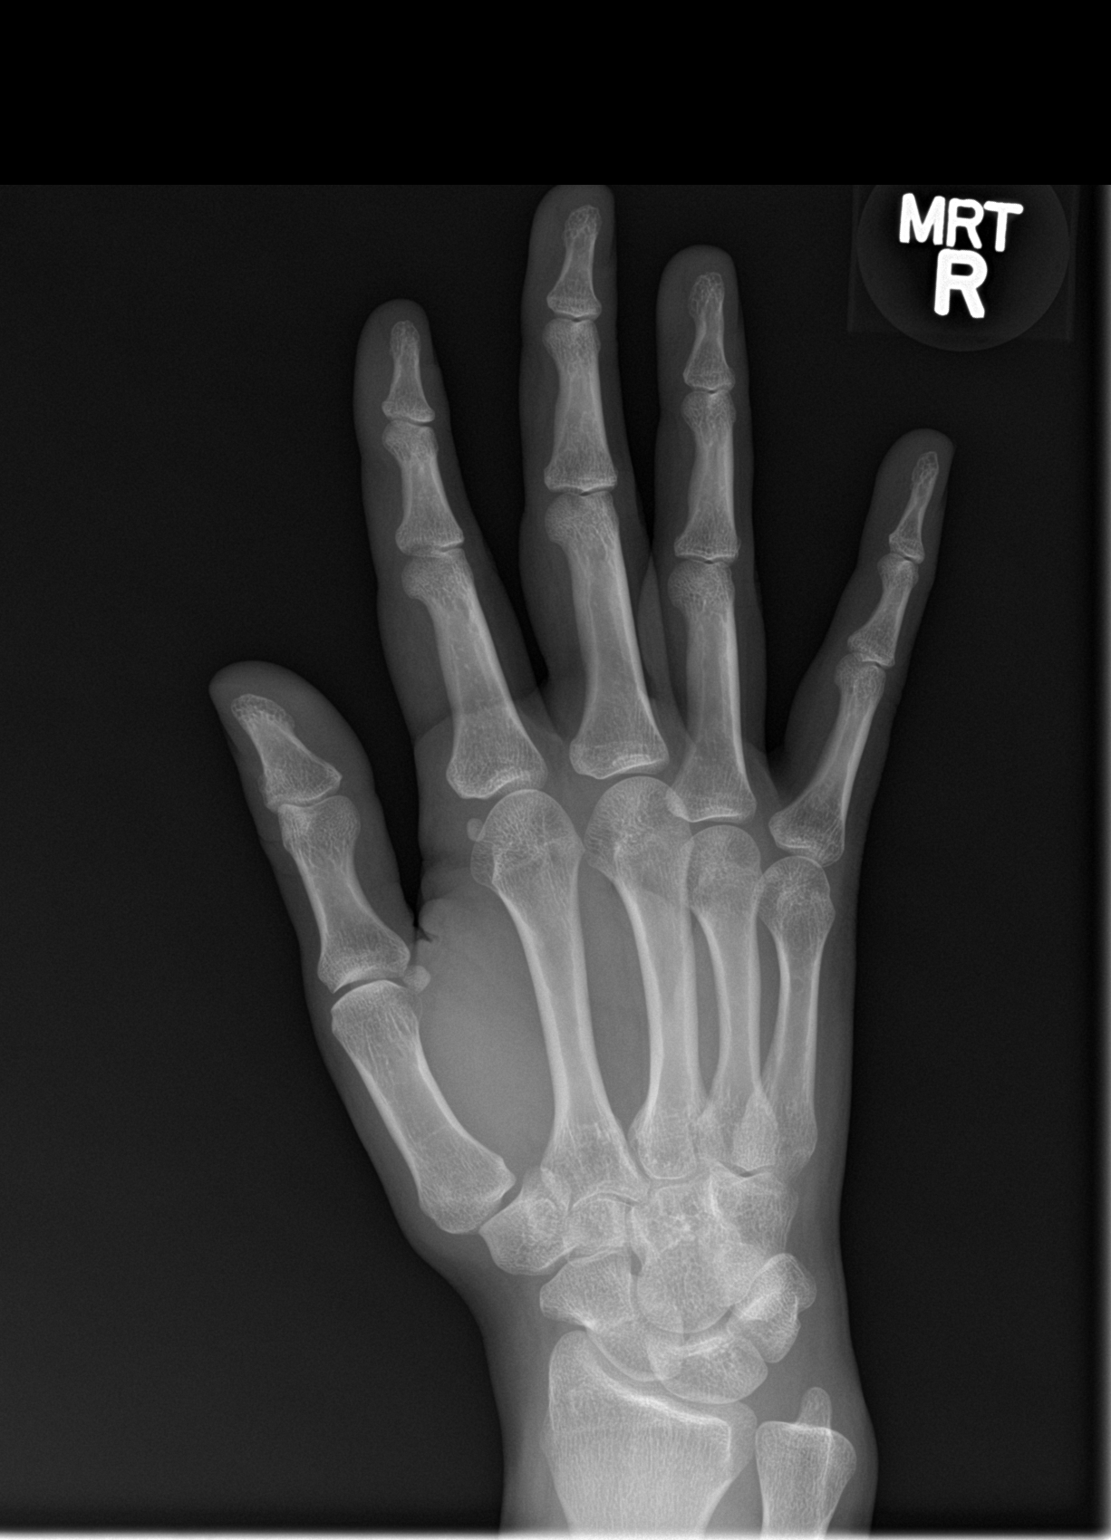

[hand lat]
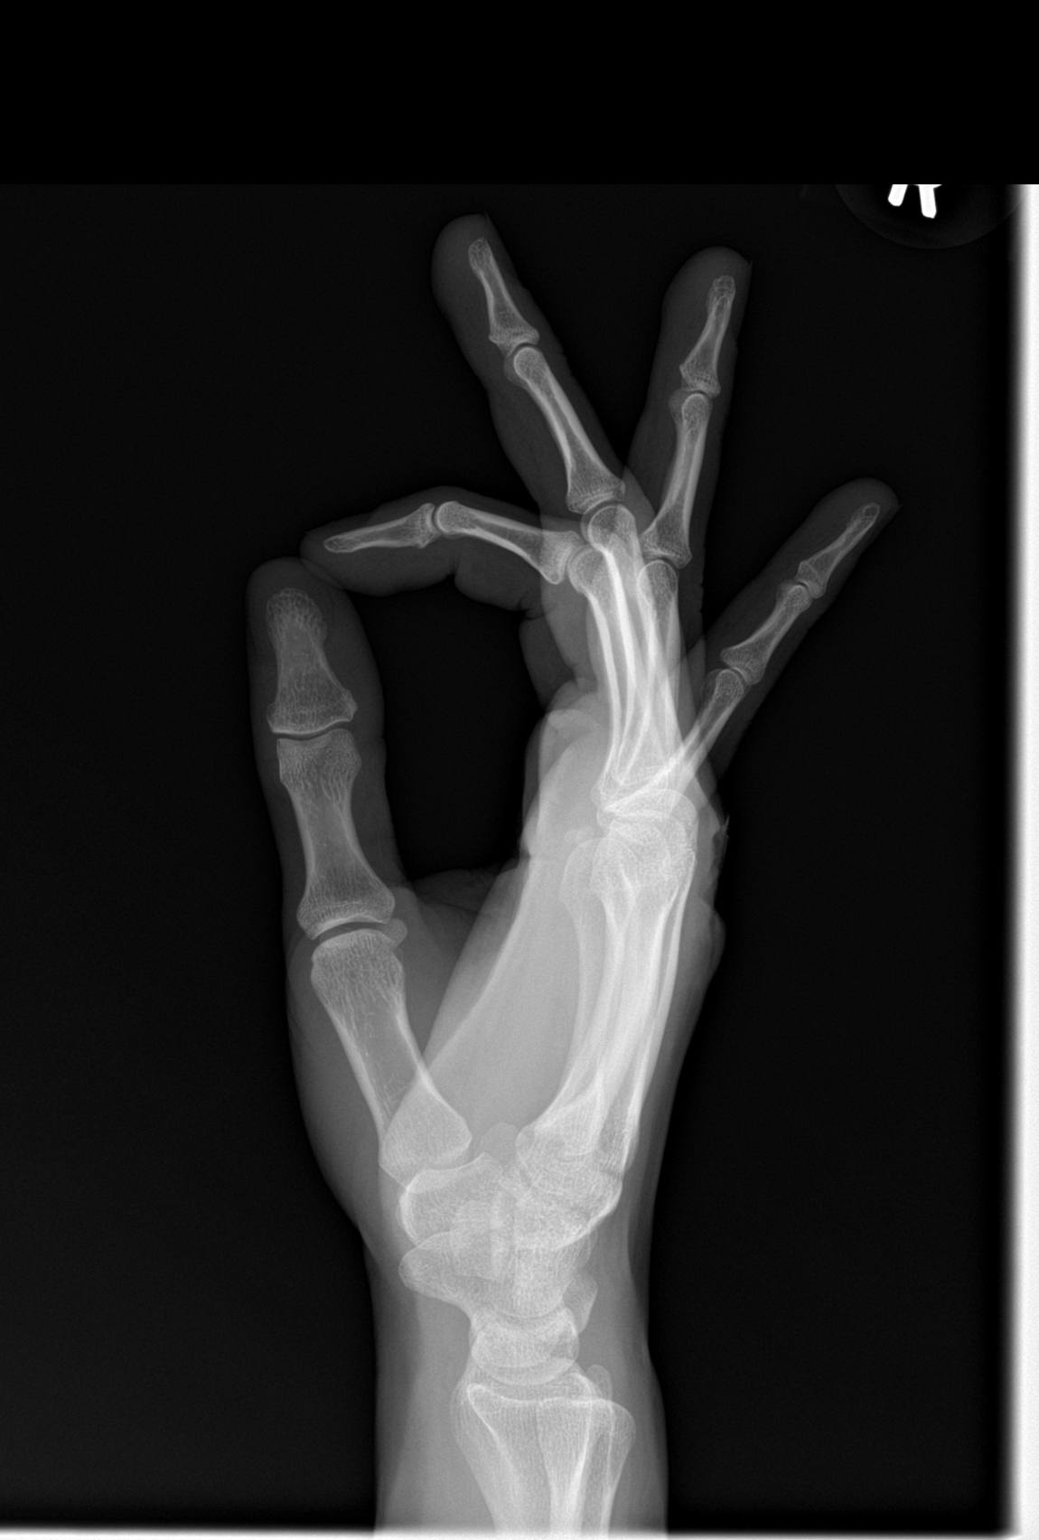

[3 of 3 positions shown; findings below may reference images not displayed]

FINDINGS: soft tissue injury about the dorsal metacarpal phalangeal joints on
the lateral view. No radiopaque foreign object. No acute fracture or
dislocation.
IMPRESSION: No acute osseous abnormality.

## 2017-04-19 ENCOUNTER — Encounter: Payer: Self-pay | Admitting: Gynecology
